# Patient Record
Sex: Female | Born: 1970 | Race: White | Hispanic: No | Marital: Single | Smoking: Never smoker
Health system: Southern US, Community
[De-identification: ages and names within clinical notes are randomized; demographics above are authoritative.]

## PROBLEM LIST (undated history)

## (undated) DIAGNOSIS — T7840XA Allergy, unspecified, initial encounter: Secondary | ICD-10-CM

## (undated) DIAGNOSIS — J45909 Unspecified asthma, uncomplicated: Secondary | ICD-10-CM

## (undated) DIAGNOSIS — R011 Cardiac murmur, unspecified: Secondary | ICD-10-CM

## (undated) HISTORY — DX: Unspecified asthma, uncomplicated: J45.909

## (undated) HISTORY — DX: Allergy, unspecified, initial encounter: T78.40XA

## (undated) HISTORY — DX: Cardiac murmur, unspecified: R01.1

## (undated) HISTORY — PX: WISDOM TOOTH EXTRACTION: SHX21

## (undated) HISTORY — PX: APPENDECTOMY: SHX54

---

## 1998-04-25 ENCOUNTER — Other Ambulatory Visit: Admission: RE | Admit: 1998-04-25 | Discharge: 1998-04-25 | Payer: Self-pay | Admitting: Obstetrics and Gynecology

## 1999-04-27 ENCOUNTER — Other Ambulatory Visit: Admission: RE | Admit: 1999-04-27 | Discharge: 1999-04-27 | Payer: Self-pay | Admitting: *Deleted

## 2000-04-22 ENCOUNTER — Other Ambulatory Visit: Admission: RE | Admit: 2000-04-22 | Discharge: 2000-04-22 | Payer: Self-pay | Admitting: *Deleted

## 2018-04-29 ENCOUNTER — Ambulatory Visit (INDEPENDENT_AMBULATORY_CARE_PROVIDER_SITE_OTHER): Payer: PRIVATE HEALTH INSURANCE | Admitting: Orthopaedic Surgery

## 2018-04-29 ENCOUNTER — Encounter (INDEPENDENT_AMBULATORY_CARE_PROVIDER_SITE_OTHER): Payer: Self-pay | Admitting: Orthopaedic Surgery

## 2018-04-29 ENCOUNTER — Ambulatory Visit (INDEPENDENT_AMBULATORY_CARE_PROVIDER_SITE_OTHER): Payer: PRIVATE HEALTH INSURANCE

## 2018-04-29 DIAGNOSIS — M25551 Pain in right hip: Secondary | ICD-10-CM

## 2018-04-29 DIAGNOSIS — M7061 Trochanteric bursitis, right hip: Secondary | ICD-10-CM

## 2018-04-29 MED ORDER — DICLOFENAC SODIUM 1 % TD GEL: 2.0000 g | Freq: Four times a day (QID) | TRANSDERMAL | 3 refills | Status: AC

## 2018-04-29 MED ORDER — METHYLPREDNISOLONE 4 MG PO TABS: ORAL_TABLET | ORAL | 0 refills | Status: AC

## 2018-04-29 NOTE — Progress Notes (Signed)
Office Visit Note   Patient: Jamie Yu           Date of Birth: 1970/07/20           MRN: [id number] Visit Date: 04/29/2018              Requested by: No referring provider defined for this encounter. PCP: System, Pcp Not In   Assessment & Plan: Visit Diagnoses:  1. Pain in right hip   2. Trochanteric bursitis, right hip     Plan: Our first I will be to consider sending her to outpatient physical therapy so they can perform a athletic assessment and running assessment on her and try other modalities to help her trochanteric area and hopefully decrease the inflammation and pain that she is experiencing.  She is not interested in a steroid injection so we will try an oral steroid and Voltaren gel which I sent into the pharmacy.  All question concerns were answered and addressed.  I would like to see her back in 4 weeks to see how she is doing overall and see if she is progressing with therapy.  Worse case scenario would be considering an MRI of her right hip to look for any pathology around the trochanteric area.  Follow-Up Instructions: Return in about 4 weeks (around 05/27/2018).   Orders:  Orders Placed This Encounter  Procedures  . XR HIP UNILAT W OR W/O PELVIS 1V RIGHT   Meds ordered this encounter  Medications  . methylPREDNISolone (MEDROL) 4 MG tablet    Sig: Medrol dose pack. Take as instructed    Dispense:  21 tablet    Refill:  0  . diclofenac sodium (VOLTAREN) 1 % GEL    Sig: Apply 2 g topically 4 (four) times daily.    Dispense:  100 g    Refill:  3      Procedures: No procedures performed   Clinical Data: No additional findings.   Subjective: Chief Complaint  Patient presents with  . Right Hip - Pain  Patient is a very active 48 year old athletic female who is an avid runner as well as multiple other types of physical activities.  She fell about 3 years ago injuring her right hip but never sought treatment for this.  Now is gotten a point where she  has pain on the lateral aspect of her hip going down her IT band.  She does get occasional groin pain she states.  This is inhibited and limited her athletic aspect of things.  Is become frustrating for her given her high level of energy and athletic ability.  She denies any radicular symptoms.  She denies any weakness in her legs or numbness and tingling her feet.  HPI  Review of Systems She currently has a headache, chest pain, shortness of breath, fever, chills, nausea, vomiting  Objective: Vital Signs: There were no vitals taken for this visit.  Physical Exam She is alert and orient x3 and in no acute distress Ortho Exam Examination of her right and left hips are entirely normal in terms of fluid and full range of motion of both hips with no pain in the groin at all.  Compressing the right hip causes no pain.  I had her lay on her side she does have significant tenderness and pain over the greater trochanter and the IT band.  There is no weakness with hip abduction but certainly there is tenderness especially at the site of the gluteus medius and minimus  tendon insertion. Specialty Comments:  No specialty comments available.  Imaging: Xr Hip Unilat W Or W/o Pelvis 1v Right  Result Date: 04/29/2018 an AP pelvis and lateral right hip shows no acute findings.  The hip joint is well located and concentric with well-maintained joint space.  There are no cortical irregularities around the pelvis or    PMFS History: There are no active problems to display for this patient.  History reviewed. No pertinent past medical history.  History reviewed. No pertinent family history.  History reviewed. No pertinent surgical history. Social History   Occupational History  . Not on file  Tobacco Use  . Smoking status: Not on file  Substance and Sexual Activity  . Alcohol use: Not on file  . Drug use: Not on file  . Sexual activity: Not on file

## 2018-05-06 ENCOUNTER — Ambulatory Visit (HOSPITAL_COMMUNITY)
Admission: RE | Admit: 2018-05-06 | Discharge: 2018-05-06 | Disposition: A | Discharge status: Home / Self Care | Payer: No Typology Code available for payment source | Source: Ambulatory Visit | Attending: Family Medicine | Admitting: Family Medicine

## 2018-05-06 ENCOUNTER — Encounter (HOSPITAL_COMMUNITY): Payer: Self-pay | Admitting: Emergency Medicine

## 2018-05-06 ENCOUNTER — Ambulatory Visit (INDEPENDENT_AMBULATORY_CARE_PROVIDER_SITE_OTHER)
Admission: EM | Admit: 2018-05-06 | Discharge: 2018-05-06 | Disposition: A | Discharge status: Home / Self Care | Payer: No Typology Code available for payment source | Source: Home / Self Care

## 2018-05-06 DIAGNOSIS — R1011 Right upper quadrant pain: Secondary | ICD-10-CM

## 2018-05-06 DIAGNOSIS — K824 Cholesterolosis of gallbladder: Secondary | ICD-10-CM | POA: Diagnosis not present

## 2018-05-06 LAB — POCT URINALYSIS DIP (DEVICE)
Bilirubin Urine: NEGATIVE
Glucose, UA: NEGATIVE mg/dL
Hgb urine dipstick: NEGATIVE
Ketones, ur: NEGATIVE mg/dL
Leukocytes,Ua: NEGATIVE
Nitrite: NEGATIVE
Protein, ur: NEGATIVE mg/dL
SPECIFIC GRAVITY, URINE: 1.01 (ref 1.005–1.030)
UROBILINOGEN UA: 0.2 mg/dL (ref 0.0–1.0)
pH: 7.5 (ref 5.0–8.0)

## 2018-05-06 LAB — POCT PREGNANCY, URINE: Preg Test, Ur: NEGATIVE

## 2018-05-06 NOTE — ED Notes (Signed)
Patient able to ambulate independently  

## 2018-05-06 NOTE — Discharge Instructions (Addendum)
Rest and push fluids.  Urine did not show signs of infection or kidney stone Ultrasound ordered.  If positive for gallstones, please follow up with PCP or general surgery for further evaluation If positive for obstructed gallstones please go to ED for further evaluation and management Return or go to the ED if you have any new or worsening symptoms such as fever, chills, nausea, vomiting, severe 10/10 abdominal pain, changes in bowel or bladder habits, etc..Marland Kitchen

## 2018-05-06 NOTE — ED Triage Notes (Signed)
PT presents to [facility] after having 12 hours of emesis with her husband on 2/8.  States starting on 2/15 she began having generalized abdominal pain, and now having RUQ pain.  Denies n/v/d.

## 2018-05-06 NOTE — ED Provider Notes (Signed)
[facility]   [id number] 05/06/18 Arrival Time: 0903  CC: ABDOMINAL DISCOMFORT  SUBJECTIVE:  Jamie Yu is a 48 y.o. female who presents with complaint of intermittent abdominal pain that began 4 days ago.  Denies a precipitating event, trauma, close contacts with similar symptoms, recent travel or antibiotic use.  Localizes pain to  RUQ.  Describes as improving, intermittent and sore in character.  Pain was 8/10, today 1-2/10.  Has tried OTC medications with temporary relief.  Worsening symptoms with eating.  Reports similar symptoms in the past with appendicitis. Complains of associated chills.    Denies fever, nausea, vomiting, chest pain, SOB, diarrhea, constipation, hematochezia, melena, dysuria, difficulty urinating, increased frequency or urgency, flank pain, loss of bowel or bladder function, vaginal discharge, vaginal odor, pelvic pain.     No LMP recorded. Patient is postmenopausal.  ROS: As per HPI.  History reviewed. No pertinent past medical history. Past Surgical History:  Procedure Laterality Date  . APPENDECTOMY     Allergies  Allergen Reactions  . Codeine Hives  . Penicillins Other (See Comments)    "intolerance"   No current facility-administered medications on file prior to encounter.    Current Outpatient Medications on File Prior to Encounter  Medication Sig Dispense Refill  . lactase (LACTAID) 3000 units tablet Take by mouth 3 (three) times daily with meals.    Marland Kitchen loratadine (CLARITIN) 10 MG tablet Take 10 mg by mouth daily.    . Multiple Vitamin (MULTIVITAMIN) LIQD Take 5 mLs by mouth daily.    . diclofenac sodium (VOLTAREN) 1 % GEL Apply 2 g topically 4 (four) times daily. 100 g 3  . methylPREDNISolone (MEDROL) 4 MG tablet Medrol dose pack. Take as instructed 21 tablet 0   Social History   Socioeconomic History  . Marital status: Single    Spouse name: Not on file  . Number of children: Not on file  . Years of education: Not on file  .  Highest education level: Not on file  Occupational History  . Not on file  Social Needs  . Financial resource strain: Not on file  . Food insecurity:    Worry: Not on file    Inability: Not on file  . Transportation needs:    Medical: Not on file    Non-medical: Not on file  Tobacco Use  . Smoking status: Never Smoker  . Smokeless tobacco: Never Used  Substance and Sexual Activity  . Alcohol use: Never    Frequency: Never  . Drug use: Never  . Sexual activity: Not on file  Lifestyle  . Physical activity:    Days per week: Not on file    Minutes per session: Not on file  . Stress: Not on file  Relationships  . Social connections:    Talks on phone: Not on file    Gets together: Not on file    Attends religious service: Not on file    Active member of club or organization: Not on file    Attends meetings of clubs or organizations: Not on file    Relationship status: Not on file  . Intimate partner violence:    Fear of current or ex partner: Not on file    Emotionally abused: Not on file    Physically abused: Not on file    Forced sexual activity: Not on file  Other Topics Concern  . Not on file  Social History Narrative  . Not on file   Family  History  Problem Relation Age of Onset  . Hypertension Father   . Heart attack Father      OBJECTIVE:  Vitals:   05/06/18 0948  BP: 119/65  Pulse: 77  Resp: 18  Temp: 97.9 F (36.6 C)  TempSrc: Oral  SpO2: 100%    General appearance: Alert; NAD HEENT: NCAT.  Oropharynx clear.  Lungs: clear to auscultation bilaterally without adventitious breath sounds Heart: regular rate and rhythm.  Radial pulses 2+ symmetrical bilaterally Abdomen: soft, non-distended; normal active bowel sounds; TTP over RUQ; nontender at McBurney's point; + Murphy's sign; negative rebound; no guarding Back: no CVA tenderness Extremities: no edema; symmetrical with no gross deformities Skin: warm and dry Neurologic: normal  gait Psychological: alert and cooperative; normal mood and affect  LABS: Results for orders placed or performed during the hospital encounter of 05/06/18 (from the past 24 hour(s))  POCT urinalysis dip (device)     Status: None   Collection Time: 05/06/18 10:26 AM  Result Value Ref Range   Glucose, UA NEGATIVE NEGATIVE mg/dL   Bilirubin Urine NEGATIVE NEGATIVE   Ketones, ur NEGATIVE NEGATIVE mg/dL   Specific Gravity, Urine 1.010 1.005 - 1.030   Hgb urine dipstick NEGATIVE NEGATIVE   pH 7.5 5.0 - 8.0   Protein, ur NEGATIVE NEGATIVE mg/dL   Urobilinogen, UA 0.2 0.0 - 1.0 mg/dL   Nitrite NEGATIVE NEGATIVE   Leukocytes,Ua NEGATIVE NEGATIVE  Pregnancy, urine POC     Status: None   Collection Time: 05/06/18 10:30 AM  Result Value Ref Range   Preg Test, Ur NEGATIVE NEGATIVE    ASSESSMENT & PLAN:  1. Abdominal pain, right upper quadrant     Rest and push fluids.  Urine did not show signs of infection or kidney stone Ultrasound ordered.  If positive for gallstones, please follow up with PCP or general surgery for further evaluation If positive for obstructed gallstones please go to ED for further evaluation and management Return or go to the ED if you have any new or worsening symptoms such as fever, chills, nausea, vomiting, severe 10/10 abdominal pain, changes in bowel or bladder habits, etc...  Reviewed expectations re: course of current medical issues. Questions answered. Outlined signs and symptoms indicating need for more acute intervention. Patient verbalized understanding. After Visit Summary given.   Rennis Harding, PA-C 05/06/18 1135

## 2018-05-27 ENCOUNTER — Ambulatory Visit (INDEPENDENT_AMBULATORY_CARE_PROVIDER_SITE_OTHER): Payer: PRIVATE HEALTH INSURANCE | Admitting: Orthopaedic Surgery

## 2018-11-20 ENCOUNTER — Other Ambulatory Visit: Payer: Self-pay | Admitting: Internal Medicine

## 2018-11-20 DIAGNOSIS — Z1231 Encounter for screening mammogram for malignant neoplasm of breast: Secondary | ICD-10-CM

## 2020-02-18 LAB — IFOBT (OCCULT BLOOD): IFOBT: NEGATIVE

## 2020-03-08 ENCOUNTER — Encounter: Payer: Self-pay | Admitting: Gastroenterology

## 2020-03-27 ENCOUNTER — Ambulatory Visit (AMBULATORY_SURGERY_CENTER): Payer: Self-pay

## 2020-03-27 ENCOUNTER — Other Ambulatory Visit: Payer: Self-pay

## 2020-03-27 VITALS — Ht 63.0 in | Wt 119.0 lb

## 2020-03-27 DIAGNOSIS — Z1211 Encounter for screening for malignant neoplasm of colon: Secondary | ICD-10-CM

## 2020-03-27 NOTE — Progress Notes (Signed)

## 2020-04-07 ENCOUNTER — Telehealth: Payer: Self-pay | Admitting: Gastroenterology

## 2020-04-07 NOTE — Telephone Encounter (Signed)
Patient called back.  Informed her her can continue to take prednisone with no issues.

## 2020-04-07 NOTE — Telephone Encounter (Signed)
1606- attempted pt - no answer - mail box full, cannot LM

## 2020-04-07 NOTE — Telephone Encounter (Signed)
Inbound call from patient she is currently taking prednisone medication and wants to make sure it is not going to interfere with procedure scheduled for 04/10/2020, should she stop taking it.  Please advise.

## 2020-04-07 NOTE — Telephone Encounter (Signed)
Attempted to call pt several times- VM full, cannot LM that prednisone is  fine to continue- no issues

## 2020-04-10 ENCOUNTER — Other Ambulatory Visit: Payer: Self-pay

## 2020-04-10 ENCOUNTER — Ambulatory Visit (AMBULATORY_SURGERY_CENTER): Payer: No Typology Code available for payment source | Admitting: Gastroenterology

## 2020-04-10 ENCOUNTER — Encounter: Payer: Self-pay | Admitting: Gastroenterology

## 2020-04-10 VITALS — BP 112/74 | HR 49 | Temp 97.2°F | Resp 10 | Ht 63.0 in | Wt 119.0 lb

## 2020-04-10 DIAGNOSIS — Z1211 Encounter for screening for malignant neoplasm of colon: Secondary | ICD-10-CM

## 2020-04-10 MED ORDER — SODIUM CHLORIDE 0.9 % IV SOLN: 500.0000 mL | Freq: Once | INTRAVENOUS | Status: DC

## 2020-04-10 NOTE — Progress Notes (Signed)
A and O x3. Report to RN. Tolerated MAC anesthesia well.

## 2020-04-10 NOTE — Patient Instructions (Signed)
Handout provided on hemorrhoids.   Repeat colonoscopy in 10 years for screening.   YOU HAD AN ENDOSCOPIC PROCEDURE TODAY AT THE  ENDOSCOPY CENTER:   Refer to the procedure report that was given to you for any specific questions about what was found during the examination.  If the procedure report does not answer your questions, please call your gastroenterologist to clarify.  If you requested that your care partner not be given the details of your procedure findings, then the procedure report has been included in a sealed envelope for you to review at your convenience later.  YOU SHOULD EXPECT: Some feelings of bloating in the abdomen. Passage of more gas than usual.  Walking can help get rid of the air that was put into your GI tract during the procedure and reduce the bloating. If you had a lower endoscopy (such as a colonoscopy or flexible sigmoidoscopy) you may notice spotting of blood in your stool or on the toilet paper. If you underwent a bowel prep for your procedure, you may not have a normal bowel movement for a few days.  Please Note:  You might notice some irritation and congestion in your nose or some drainage.  This is from the oxygen used during your procedure.  There is no need for concern and it should clear up in a day or so.  SYMPTOMS TO REPORT IMMEDIATELY:   Following lower endoscopy (colonoscopy or flexible sigmoidoscopy):  Excessive amounts of blood in the stool  Significant tenderness or worsening of abdominal pains  Swelling of the abdomen that is new, acute  Fever of 100F or higher  For urgent or emergent issues, a gastroenterologist can be reached at any hour by calling [phone]. Do not use MyChart messaging for urgent concerns.    DIET:  We do recommend a small meal at first, but then you may proceed to your regular diet.  Drink plenty of fluids but you should avoid alcoholic beverages for 24 hours.  ACTIVITY:  You should plan to take it easy for the  rest of today and you should NOT DRIVE or use heavy machinery until tomorrow (because of the sedation medicines used during the test).    FOLLOW UP: Our staff will call the number listed on your records 48-72 hours following your procedure to check on you and address any questions or concerns that you may have regarding the information given to you following your procedure. If we do not reach you, we will leave a message.  We will attempt to reach you two times.  During this call, we will ask if you have developed any symptoms of COVID 19. If you develop any symptoms (ie: fever, flu-like symptoms, shortness of breath, cough etc.) before then, please call [phone].  If you test positive for Covid 19 in the 2 weeks post procedure, please call and report this information to [place].    If any biopsies were taken you will be contacted by phone or by letter within the next 1-3 weeks.  Please call us at [phone] if you have not heard about the biopsies in 3 weeks.    SIGNATURES/CONFIDENTIALITY: You and/or your care partner have signed paperwork which will be entered into your electronic medical record.  These signatures attest to the fact that that the information above on your After Visit Summary has been reviewed and is understood.  Full responsibility of the confidentiality of this discharge information lies with you and/or your care-partner.

## 2020-04-10 NOTE — Op Note (Signed)
[facility] Patient Name: Jamie Yu Procedure Date: 04/10/2020 7:56 AM MRN: [id number] Endoscopist: Rachael Fee , MD Age: 50 Referring MD:  Date of Birth: 08-14-1970 Gender: Female Account #: [id number] Procedure:                Colonoscopy Indications:              Screening for colorectal malignant neoplasm Medicines:                Monitored Anesthesia Care Procedure:                Pre-Anesthesia Assessment:                           - Prior to the procedure, a History and Physical                            was performed, and patient medications and                            allergies were reviewed. The patient's tolerance of                            previous anesthesia was also reviewed. The risks                            and benefits of the procedure and the sedation                            options and risks were discussed with the patient.                            All questions were answered, and informed consent                            was obtained. Prior Anticoagulants: The patient has                            taken no previous anticoagulant or antiplatelet                            agents. ASA Grade Assessment: II - A patient with                            mild systemic disease. After reviewing the risks                            and benefits, the patient was deemed in                            satisfactory condition to undergo the procedure.                           After obtaining informed consent, the colonoscope  was passed under direct vision. Throughout the                            procedure, the patient's blood pressure, pulse, and                            oxygen saturations were monitored continuously. The                            Olympus PFC-H190DL [id number]) Colonoscope was                            introduced through the anus and advanced to the the                            cecum, identified  by appendiceal orifice and                            ileocecal valve. The colonoscopy was performed                            without difficulty. The patient tolerated the                            procedure well. The quality of the bowel                            preparation was good. Scope In: 7:59:18 AM Scope Out: 8:13:49 AM Scope Withdrawal Time: 0 hours 8 minutes 41 seconds  Total Procedure Duration: 0 hours 14 minutes 31 seconds  Findings:                 The entire examined colon appeared normal on direct                            and retroflexion views.                           Small external hemorrhoids. Complications:            No immediate complications. Estimated blood loss:                            None. Estimated Blood Loss:     Estimated blood loss: none. Impression:               - The entire examined colon is normal on direct and                            retroflexion views.                           - No polyps or cancers.                           - Small external hemorrhoids. Recommendation:           -  Patient has a contact number available for                            emergencies. The signs and symptoms of potential                            delayed complications were discussed with the                            patient. Return to normal activities tomorrow.                            Written discharge instructions were provided to the                            patient.                           - Resume previous diet.                           - Continue present medications.                           - Repeat colonoscopy in 10 years for screening. Rachael Fee, MD 04/10/2020 8:19:38 AM This report has been signed electronically.

## 2020-04-10 NOTE — Progress Notes (Signed)
J.K. vital signs. 

## 2020-04-10 NOTE — Progress Notes (Signed)
Pt's states no medical or surgical changes since previsit or office visit. 

## 2020-04-12 ENCOUNTER — Telehealth: Payer: Self-pay | Admitting: *Deleted

## 2020-04-12 NOTE — Telephone Encounter (Signed)
  Follow up Call-  Call back number 04/10/2020  Post procedure Call Back phone  # [phone]  Permission to leave phone message Yes  Some recent data might be hidden     Patient questions:  Do you have a fever, pain , or abdominal swelling? No. Pain Score  0 *  Have you tolerated food without any problems? Yes.    Have you been able to return to your normal activities? Yes.    Do you have any questions about your discharge instructions: Diet   No. Medications  No. Follow up visit  No.  Do you have questions or concerns about your Care? No.  Actions: * If pain score is 4 or above: No action needed, pain <4.  1. Have you developed a fever since your procedure? no  2.   Have you had an respiratory symptoms (SOB or cough) since your procedure? no  3.   Have you tested positive for COVID 19 since your procedure no  4.   Have you had any family members/close contacts diagnosed with the COVID 19 since your procedure? no   If yes to any of these questions please route to Laverna Peace, RN and Karlton Lemon, RN

## 2020-05-18 IMAGING — US US ABDOMEN LIMITED
1 series · 14 of 25 positions shown · non-contrast
Comparison: None.

CLINICAL DATA: Right upper quadrant pain

EXAM:
ULTRASOUND ABDOMEN LIMITED RIGHT UPPER QUADRANT

[Series 1: us abdomen limited · 14 of 43 slices shown]
[im 1/43]
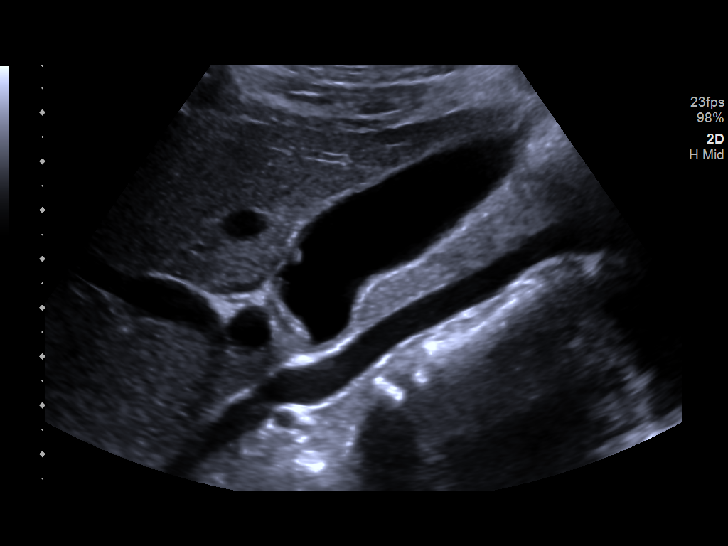
[im 4/43]
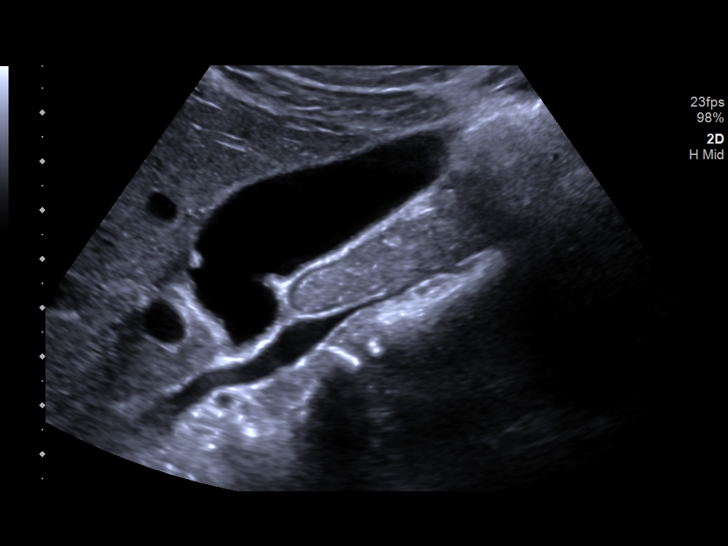
[im 8/43]
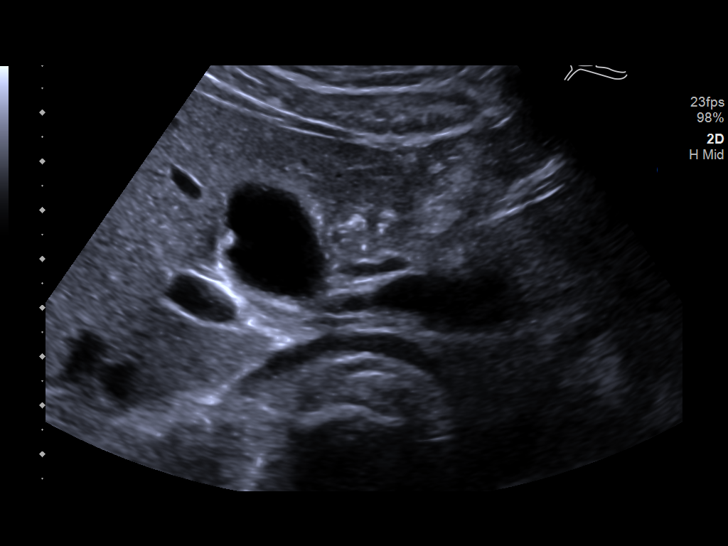
[im 11/43]
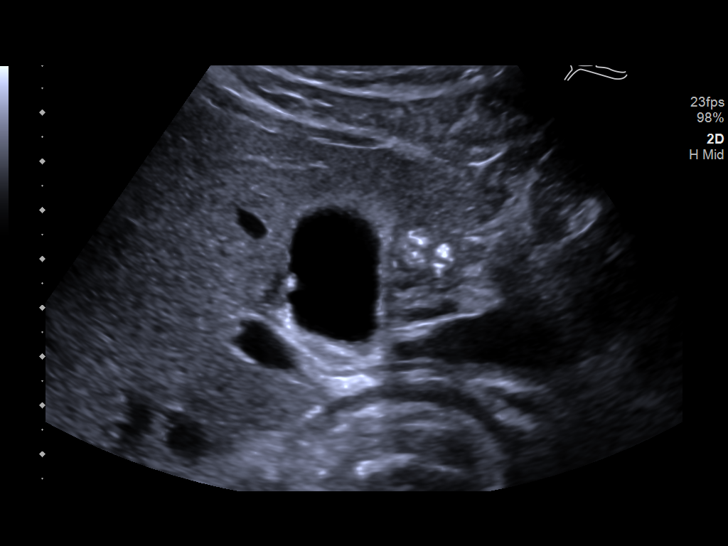
[im 15/43]
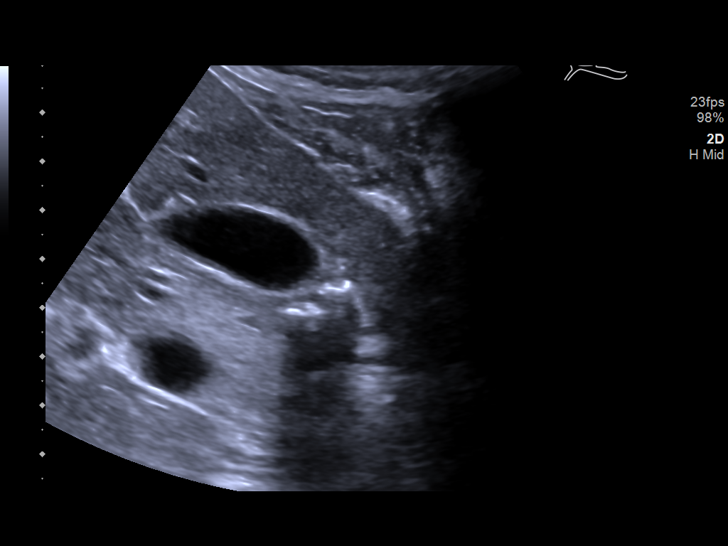
[im 16/43]
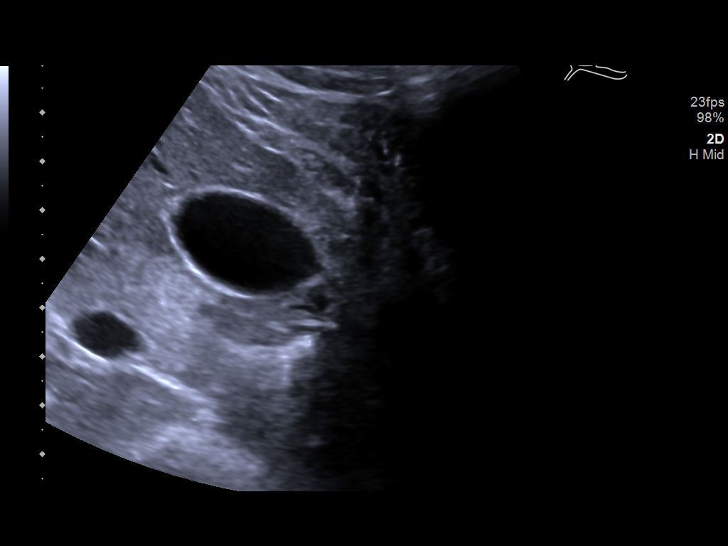
[im 20/43]
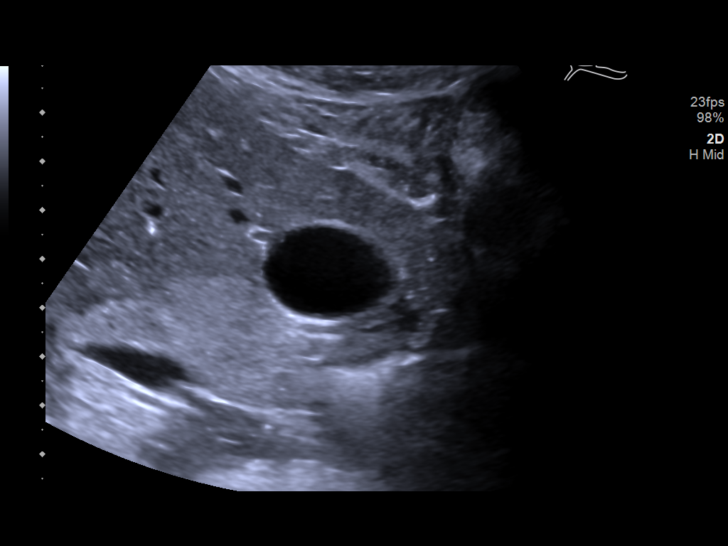
[im 23/43]
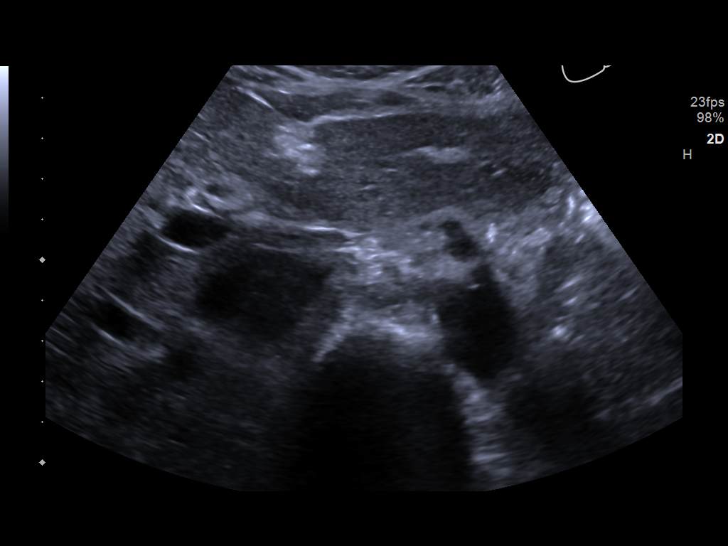
[im 27/43]
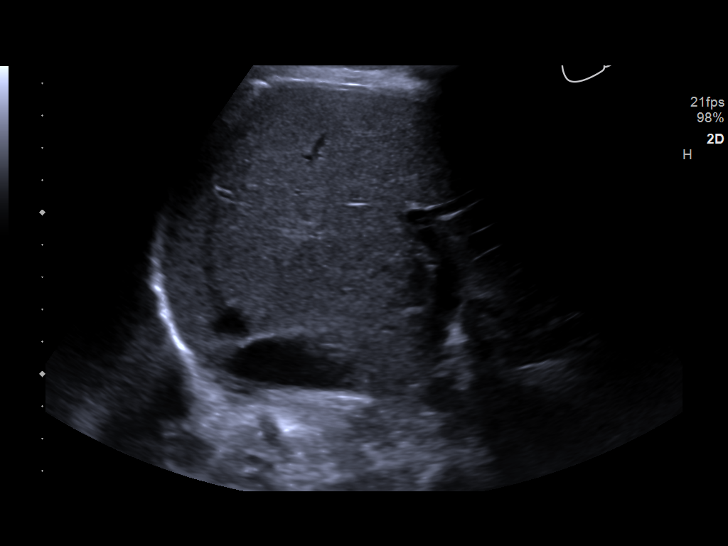
[im 29/43]
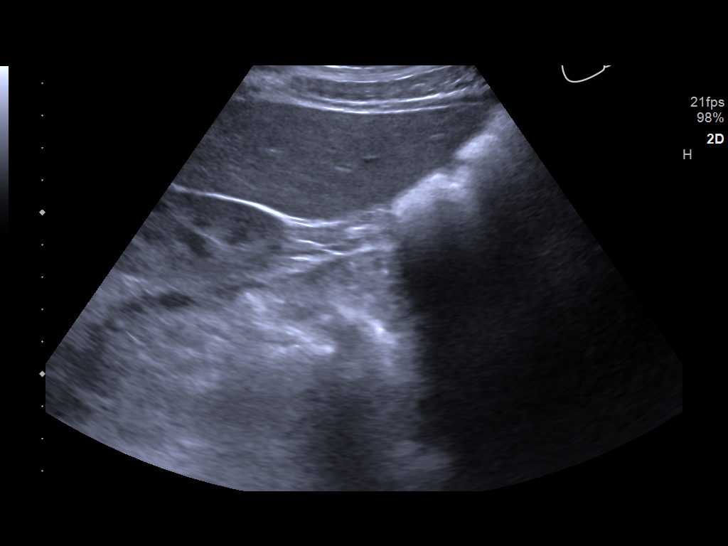
[im 32/43]
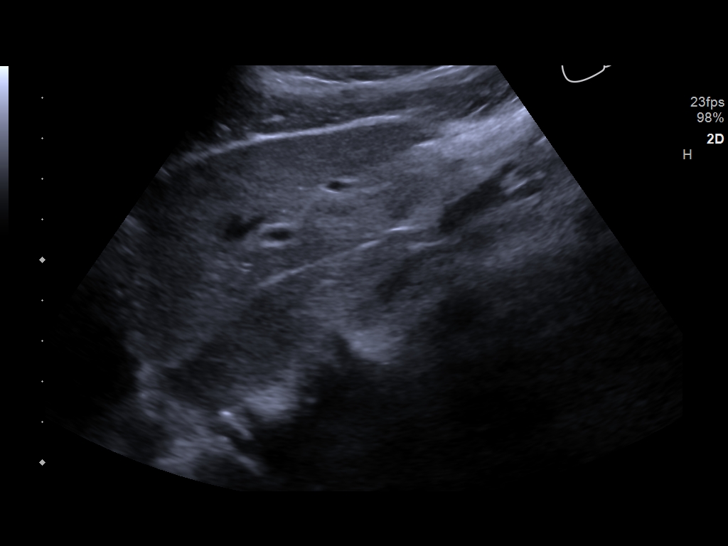
[im 36/43]
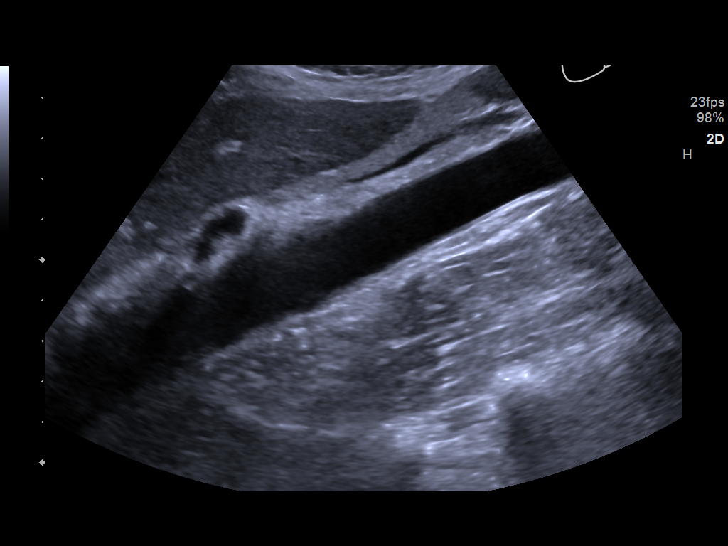
[im 39/43]
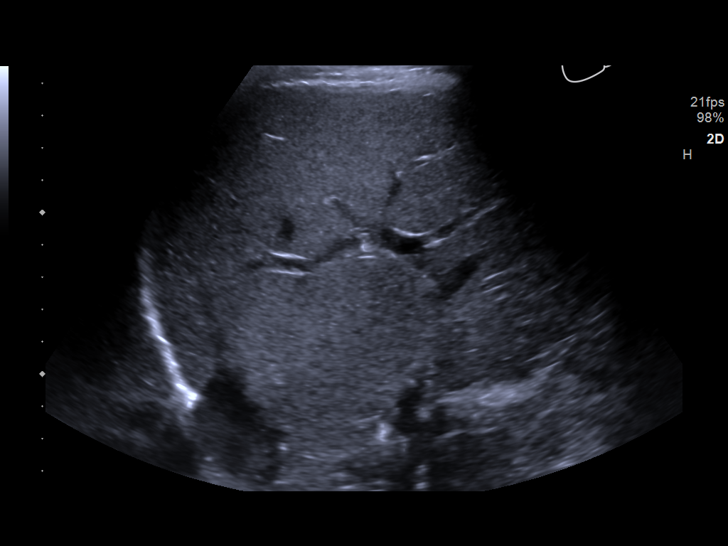
[im 43/43]
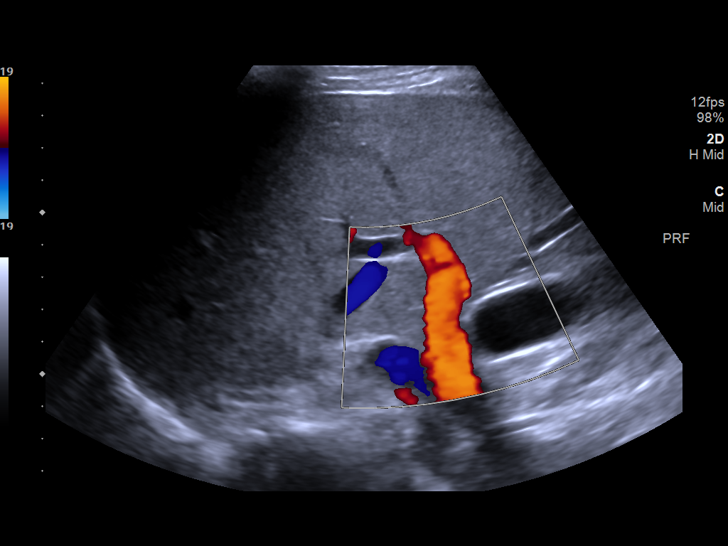

[14 of 25 positions shown; findings below may reference images not displayed]

FINDINGS: Gallbladder:

Within the gallbladder, there is a 3 mm echogenic focus which
neither moves nor shadows consistent with a small polyp. There are
no echogenic foci in the gallbladder which move and shadow as is
expected with cholelithiasis. No gallbladder wall thickening or
pericholecystic fluid. No sonographic Murphy sign noted by
sonographer.

Common bile duct:

Diameter: 4 mm. No intrahepatic or extrahepatic biliary duct
dilatation.

Liver:

No focal lesion identified. Within normal limits in parenchymal
echogenicity. Portal vein is patent on color Doppler imaging with
normal direction of blood flow towards the liver.
IMPRESSION: 3 mm apparent polyp in the gallbladder. Per consensus guidelines, a
polyp of this small size does not warrant additional imaging
surveillance. Gallbladder otherwise appears normal.

Study otherwise unremarkable.

## 2020-11-09 ENCOUNTER — Encounter (HOSPITAL_COMMUNITY): Payer: Self-pay | Admitting: Emergency Medicine

## 2020-11-09 ENCOUNTER — Ambulatory Visit (HOSPITAL_COMMUNITY)
Admission: EM | Admit: 2020-11-09 | Discharge: 2020-11-09 | Disposition: A | Discharge status: Home / Self Care | Payer: No Typology Code available for payment source | Attending: Internal Medicine | Admitting: Internal Medicine

## 2020-11-09 ENCOUNTER — Other Ambulatory Visit: Payer: Self-pay

## 2020-11-09 DIAGNOSIS — K2900 Acute gastritis without bleeding: Secondary | ICD-10-CM | POA: Diagnosis not present

## 2020-11-09 LAB — POCT URINALYSIS DIPSTICK, ED / UC
Bilirubin Urine: NEGATIVE
Glucose, UA: NEGATIVE mg/dL
Hgb urine dipstick: NEGATIVE
Ketones, ur: NEGATIVE mg/dL
Leukocytes,Ua: NEGATIVE
Nitrite: NEGATIVE
Protein, ur: NEGATIVE mg/dL
Specific Gravity, Urine: 1.01 (ref 1.005–1.030)
Urobilinogen, UA: 0.2 mg/dL (ref 0.0–1.0)
pH: 7 (ref 5.0–8.0)

## 2020-11-09 MED ORDER — PANTOPRAZOLE SODIUM 40 MG PO TBEC: 40.0000 mg | DELAYED_RELEASE_TABLET | Freq: Every day | ORAL | 0 refills | Status: AC

## 2020-11-09 MED ORDER — LIDOCAINE VISCOUS HCL 2 % MT SOLN
15.0000 mL | Freq: Once | OROMUCOSAL | Status: AC
  Administered 2020-11-09: 15 mL via ORAL

## 2020-11-09 MED ORDER — ALUM & MAG HYDROXIDE-SIMETH 200-200-20 MG/5ML PO SUSP
30.0000 mL | Freq: Once | ORAL | Status: AC
  Administered 2020-11-09: 30 mL via ORAL

## 2020-11-09 MED ORDER — LIDOCAINE VISCOUS HCL 2 % MT SOLN
OROMUCOSAL | Status: AC
  Filled 2020-11-09: qty 15

## 2020-11-09 MED ORDER — ALUM & MAG HYDROXIDE-SIMETH 200-200-20 MG/5ML PO SUSP
ORAL | Status: AC
  Filled 2020-11-09: qty 30

## 2020-11-09 MED ORDER — ALUMINUM-MAGNESIUM-SIMETHICONE 200-200-20 MG/5ML PO SUSP: 15.0000 mL | Freq: Three times a day (TID) | ORAL | 0 refills | Status: AC

## 2020-11-09 NOTE — Discharge Instructions (Addendum)
Your urinalysis today was negative for infection  Therefore I think the cause of your symptoms are either gastritis or muscular,  Take Protonix daily  Can use 15 mils of Maalox 4 times a day, before meals and before bed to help relieve symptoms  At any point abdominal pain becomes severe please follow-up with the nearest emergency department for further evaluation  Muscular pain can take 600 mg of ibuprofen 3 times a day with food, recommend taking consistently for 5 days then as needed to truly reduce muscular pain  Can use heating pad over affected area and 15-minute intervals  Has pain with significantly stretch muscles to further loosen tension  If pain has not improved by Monday please follow-up at urgent care or with primary care provider for reevaluation
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
## 2020-11-09 NOTE — ED Provider Notes (Cosign Needed)
[facility]    CSN: [id number] Arrival date & time: 11/09/20  1733      History   Chief Complaint Chief Complaint  Patient presents with   rib cage pain    HPI Jamie Yu is a 50 y.o. female.   Patient presents with left-sided upper abdominal pain that intermittently radiates across entire back to right side and intermittently across abdomen to epigastric region for 1 day.  Pain started abruptly, no precipitating event.  Worsened by twisting of torso but cannot be felt with bending.  Endorses urinary frequency, intermittent shortness of breath and nausea.  Denies vomiting, diarrhea, fevers, chills, heartburn and indigestion, chest pain or tightness, pain with deep breathing, wheezing.  Past Medical History:  Diagnosis Date   Allergy    seasonal   Asthma    states from allergies   Heart murmur    in 20s    There are no problems to display for this patient.   Past Surgical History:  Procedure Laterality Date   APPENDECTOMY     WISDOM TOOTH EXTRACTION      OB History   No obstetric history on file.      Home Medications    Prior to Admission medications   Medication Sig Start Date End Date Taking? Authorizing Provider  Calcium Citrate-Vitamin D (CITRACAL + D PO) Take 500 mg by mouth daily.   Yes [provider]  loratadine (CLARITIN) 10 MG tablet Take 10 mg by mouth daily.   Yes [provider]  Multiple Vitamin (MULTIVITAMIN) LIQD Take 5 mLs by mouth daily.   Yes [provider]  Probiotic Product (ALIGN PO) Take by mouth.   Yes [provider]  diclofenac sodium (VOLTAREN) 1 % GEL Apply 2 g topically 4 (four) times daily. 04/29/18   Kathryne Hitch, MD  lactase (LACTAID) 3000 units tablet Take by mouth 3 (three) times daily with meals.    [provider]  Magnesium 100 MG TABS Take by mouth.    [provider]  methylPREDNISolone (MEDROL) 4 MG tablet Medrol dose pack. Take as instructed  04/29/18   Kathryne Hitch, MD    Family History Family History  Problem Relation Age of Onset   Hypertension Father    Heart attack Father    Colon polyps Father    Colon cancer Neg Hx    Esophageal cancer Neg Hx    Rectal cancer Neg Hx    Stomach cancer Neg Hx     Social History Social History   Tobacco Use   Smoking status: Never   Smokeless tobacco: Never  Vaping Use   Vaping Use: Never used  Substance Use Topics   Alcohol use: Never   Drug use: Never     Allergies   Codeine and Penicillins   Review of Systems Review of Systems Defer to HPI   Physical Exam Triage Vital Signs ED Triage Vitals  Enc Vitals Group     BP 11/09/20 1743 129/82     Pulse Rate 11/09/20 1743 (!) 57     Resp --      Temp 11/09/20 1743 98.8 F (37.1 C)     Temp Source 11/09/20 1743 Oral     SpO2 11/09/20 1743 98 %     Weight --      Height --      Head Circumference --      Peak Flow --      Pain Score 11/09/20 1759  4     Pain Loc --      Pain Edu? --      Excl. in GC? --    No data found.  Updated Vital Signs BP 129/82 (BP Location: Right Arm)   Pulse (!) 57   Temp 98.8 F (37.1 C) (Oral)   SpO2 98%   Visual Acuity Right Eye Distance:   Left Eye Distance:   Bilateral Distance:    Right Eye Near:   Left Eye Near:    Bilateral Near:     Physical Exam Constitutional:      Appearance: Normal appearance. She is normal weight.  HENT:     Head: Normocephalic.  Eyes:     Extraocular Movements: Extraocular movements intact.  Pulmonary:     Effort: Pulmonary effort is normal.  Abdominal:     General: Abdomen is flat. Bowel sounds are normal.     Palpations: Abdomen is soft.     Tenderness: There is abdominal tenderness in the epigastric area and periumbilical area. There is no right CVA tenderness, left CVA tenderness or guarding.  Musculoskeletal:        General: Normal range of motion.  Skin:    General: Skin is warm and dry.  Neurological:      Mental Status: She is alert and oriented to person, place, and time. Mental status is at baseline.  Psychiatric:        Mood and Affect: Mood normal.        Behavior: Behavior normal.     UC Treatments / Results  Labs (all labs ordered are listed, but only abnormal results are displayed) Labs Reviewed  POCT URINALYSIS DIPSTICK, ED / UC    EKG   Radiology No results found.  Procedures Procedures (including critical care time)  Medications Ordered in UC Medications - No data to display  Initial Impression / Assessment and Plan / UC Course  I have reviewed the triage vital signs and the nursing notes.  Pertinent labs & imaging results that were available during my care of the patient were reviewed by me and considered in my medical decision making (see chart for details).  Gastritis  1. Maalox/Mylanta with lidocaine 30 mL now 2.  Maalox 30 mils 4 times a day 3.  Protonix 40 mg daily 4.  Return precautions given to follow-up in urgent emergency department for worsening abdominal pain, return precautions given to follow-up in urgent care if no improvement seen within 72 hours Final Clinical Impressions(s) / UC Diagnoses   Final diagnoses:  None   Discharge Instructions   None    ED Prescriptions   None    PDMP not reviewed this encounter.   Valinda Hoar, [place] 11/09/20 [phone]

## 2020-11-10 ENCOUNTER — Other Ambulatory Visit (HOSPITAL_COMMUNITY): Payer: Self-pay | Admitting: Internal Medicine

## 2020-11-10 DIAGNOSIS — R1013 Epigastric pain: Secondary | ICD-10-CM

## 2020-11-13 ENCOUNTER — Ambulatory Visit (HOSPITAL_COMMUNITY)
Admission: RE | Admit: 2020-11-13 | Discharge: 2020-11-13 | Disposition: A | Discharge status: Home / Self Care | Payer: No Typology Code available for payment source | Source: Ambulatory Visit | Attending: Internal Medicine | Admitting: Internal Medicine

## 2020-11-13 ENCOUNTER — Other Ambulatory Visit: Payer: Self-pay

## 2020-11-13 DIAGNOSIS — R1013 Epigastric pain: Secondary | ICD-10-CM | POA: Diagnosis present

## 2021-06-14 ENCOUNTER — Inpatient Hospital Stay: Admission: RE | Admit: 2021-06-14 | Payer: Self-pay | Source: Ambulatory Visit

## 2021-06-14 ENCOUNTER — Other Ambulatory Visit: Payer: Self-pay | Admitting: Chiropractic Medicine

## 2021-06-14 DIAGNOSIS — M549 Dorsalgia, unspecified: Secondary | ICD-10-CM

## 2021-08-03 ENCOUNTER — Other Ambulatory Visit: Payer: Self-pay | Admitting: Internal Medicine

## 2021-08-03 DIAGNOSIS — R1011 Right upper quadrant pain: Secondary | ICD-10-CM

## 2022-11-26 IMAGING — US US ABDOMEN LIMITED
1 series · 15 of 25 positions shown · non-contrast
Comparison: 05/06/2018

CLINICAL DATA: Right upper quadrant abdominal pain.

EXAM:
ULTRASOUND ABDOMEN LIMITED RIGHT UPPER QUADRANT

[Series 1: us abdomen limited ruq [facility] · 15 of 62 slices shown]
[im 1/62]
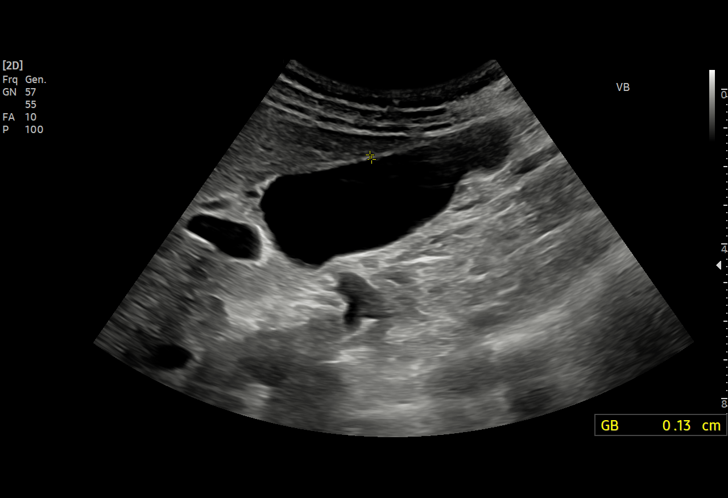
[im 6/62]
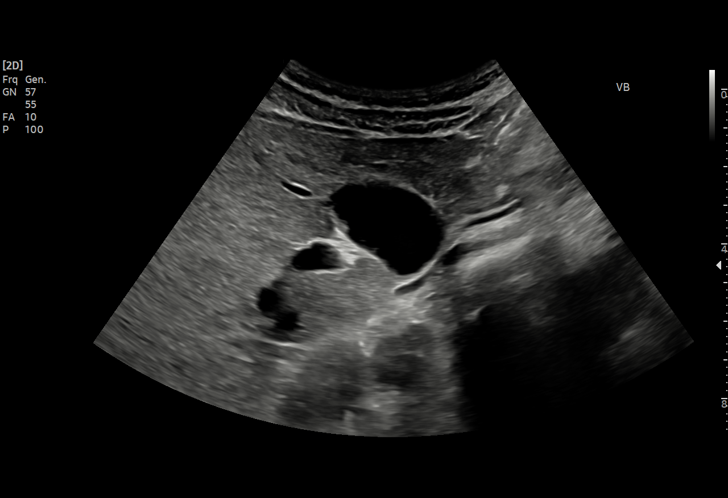
[im 11/62]
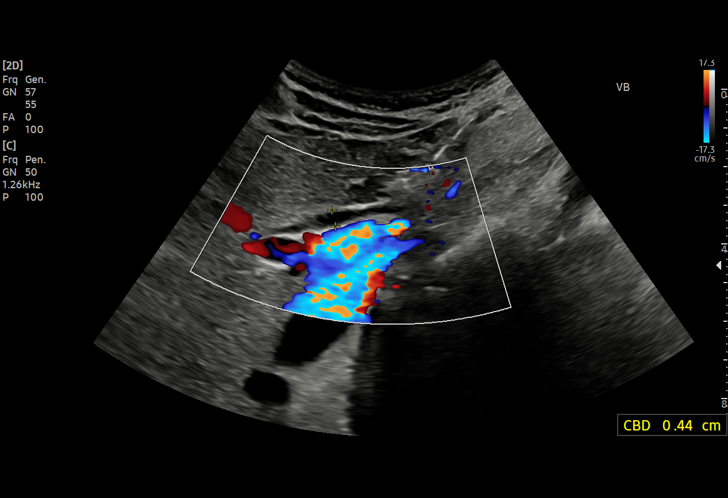
[im 13/62]
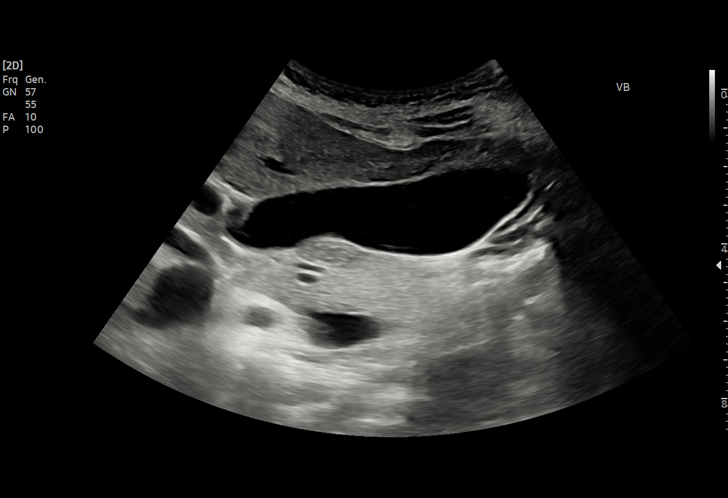
[im 18/62]
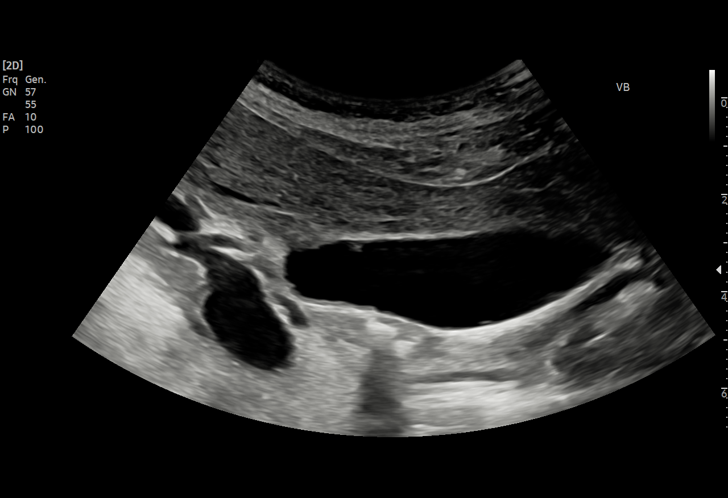
[im 23/62]
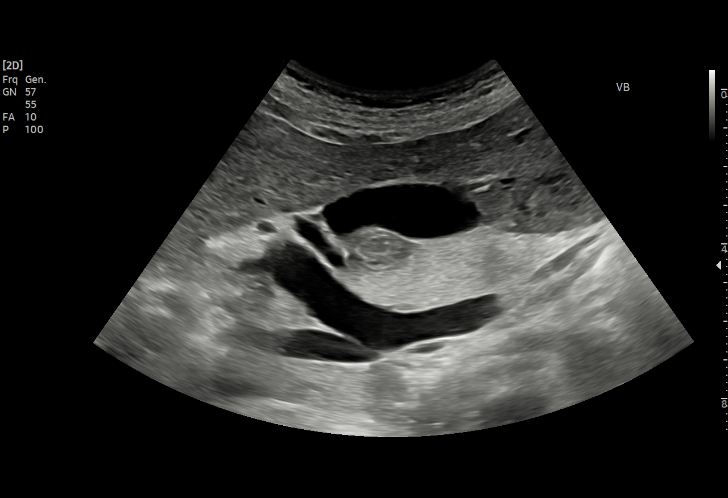
[im 26/62]
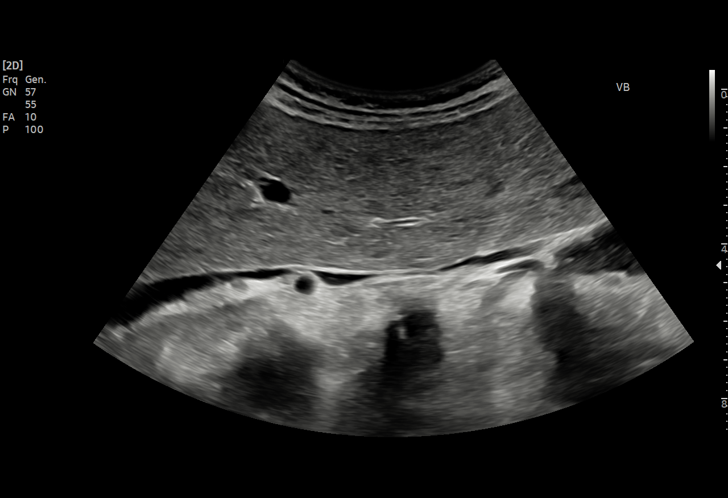
[im 31/62]
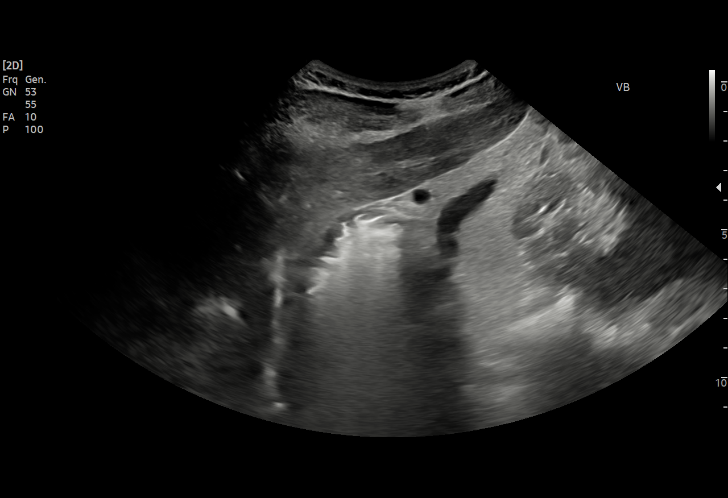
[im 36/62]
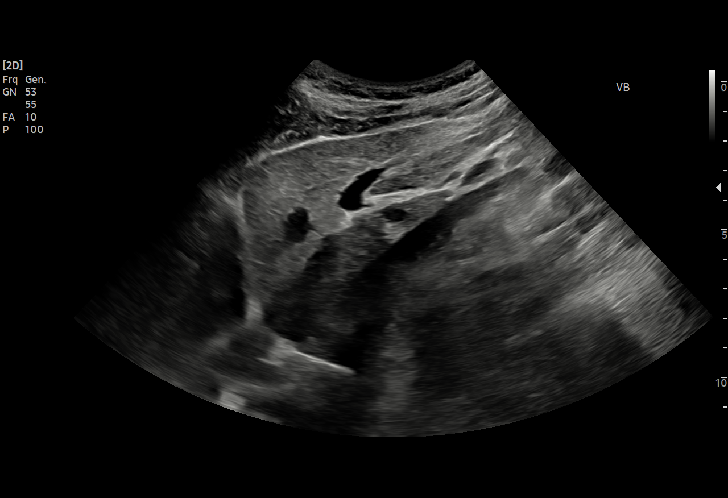
[im 39/62]
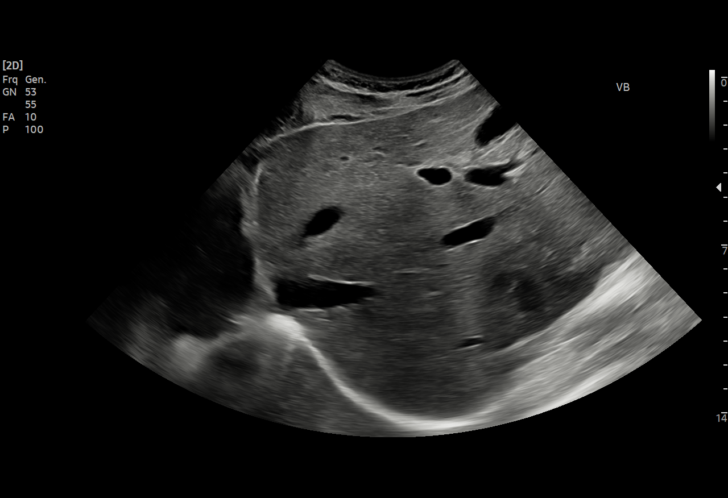
[im 44/62]
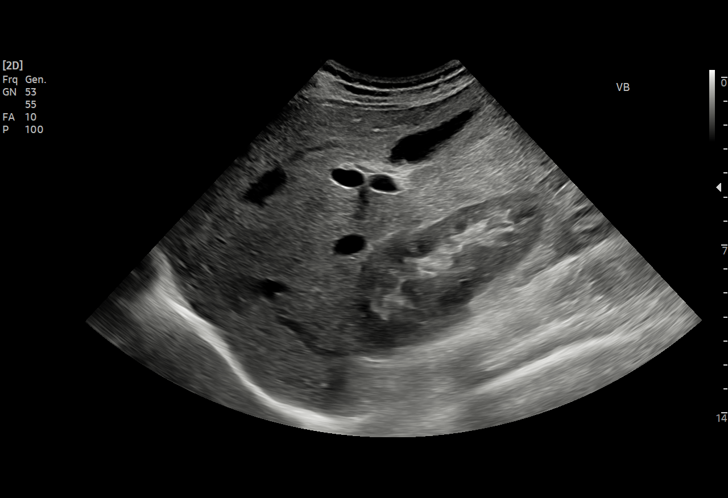
[im 49/62]
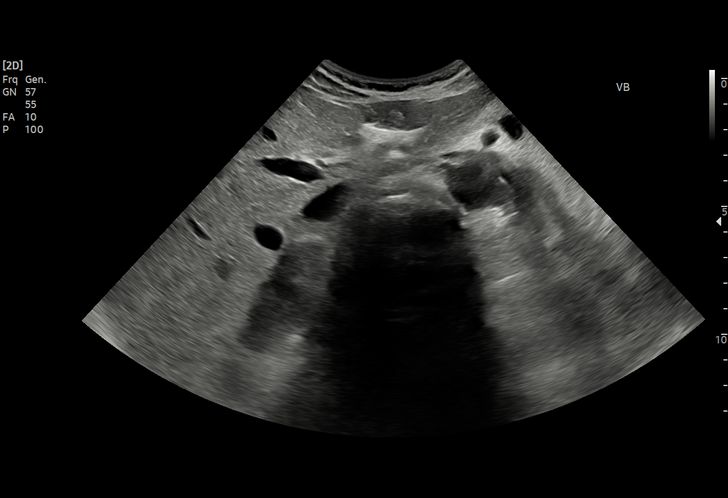
[im 51/62]
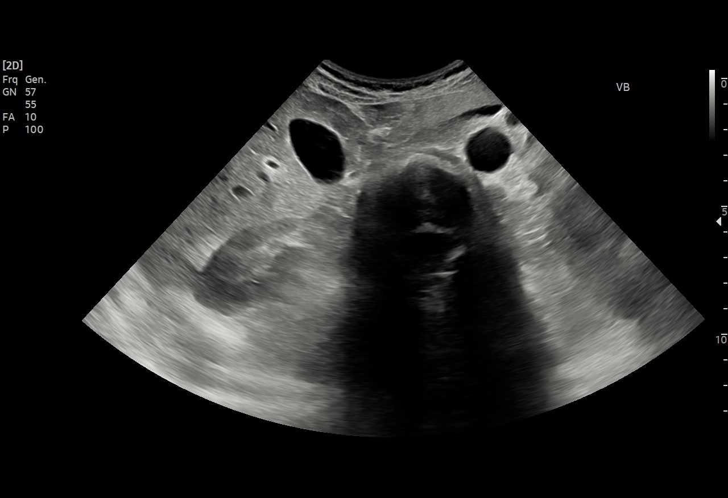
[im 56/62]
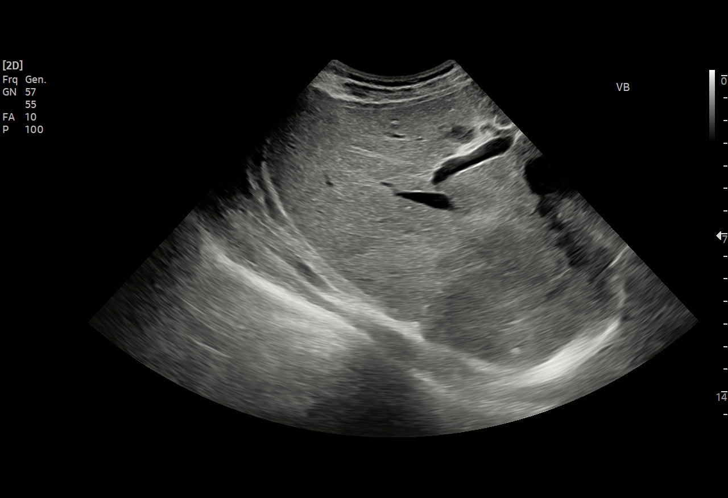
[im 62/62]
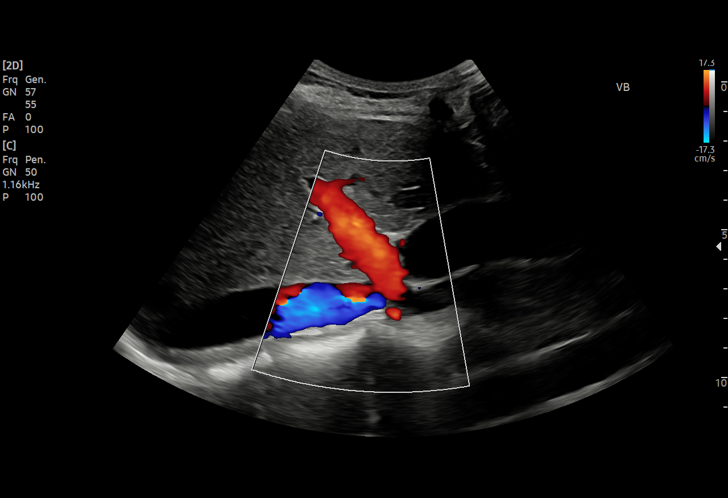

[15 of 25 positions shown; findings below may reference images not displayed]

FINDINGS: Gallbladder:

No gallstones or wall thickening visualized. Tiny cholesterol polyp
evident. No sonographic Murphy sign noted by sonographer.

Common bile duct:

Diameter: 5 mm

Liver:

No focal lesion identified. Within normal limits in parenchymal
echogenicity. Portal vein is patent on color Doppler imaging with
normal direction of blood flow towards the liver.

Other: None.
IMPRESSION: 1. Stable tiny cholesterol polyp in the gallbladder measuring 3-4
mm, benign.
2. Otherwise unremarkable.
